# Patient Record
Sex: Male | Born: 1995 | Race: Black or African American | Hispanic: No | Marital: Single | Smoking: Current some day smoker
Health system: Southern US, Community
[De-identification: ages and names within clinical notes are randomized; demographics above are authoritative.]

## PROBLEM LIST (undated history)

## (undated) DIAGNOSIS — T7840XA Allergy, unspecified, initial encounter: Secondary | ICD-10-CM

## (undated) HISTORY — DX: Allergy, unspecified, initial encounter: T78.40XA

---
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
## 2014-12-04 ENCOUNTER — Encounter: Payer: Self-pay | Admitting: Internal Medicine

## 2015-02-16 ENCOUNTER — Ambulatory Visit (INDEPENDENT_AMBULATORY_CARE_PROVIDER_SITE_OTHER): Payer: Managed Care, Other (non HMO)

## 2015-02-16 ENCOUNTER — Ambulatory Visit (INDEPENDENT_AMBULATORY_CARE_PROVIDER_SITE_OTHER): Payer: Managed Care, Other (non HMO) | Admitting: Family Medicine

## 2015-02-16 VITALS — BP 122/70 | HR 66 | Temp 98.4°F | Resp 17 | Ht 67.0 in | Wt 159.0 lb

## 2015-02-16 DIAGNOSIS — M25531 Pain in right wrist: Secondary | ICD-10-CM

## 2015-02-16 DIAGNOSIS — M25511 Pain in right shoulder: Secondary | ICD-10-CM | POA: Diagnosis not present

## 2015-02-16 DIAGNOSIS — S46911A Strain of unspecified muscle, fascia and tendon at shoulder and upper arm level, right arm, initial encounter: Secondary | ICD-10-CM

## 2015-02-16 DIAGNOSIS — S66911A Strain of unspecified muscle, fascia and tendon at wrist and hand level, right hand, initial encounter: Secondary | ICD-10-CM | POA: Diagnosis not present

## 2015-02-16 MED ORDER — MELOXICAM 15 MG PO TABS: 15.0000 mg | ORAL_TABLET | Freq: Every day | ORAL | Status: AC

## 2015-02-16 MED ORDER — METHOCARBAMOL 500 MG PO TABS: 500.0000 mg | ORAL_TABLET | Freq: Every evening | ORAL | Status: AC | PRN

## 2015-02-16 NOTE — Patient Instructions (Addendum)
Because you received an x-ray today, you will receive an invoice from [facility]. Please contact [facility] at [phone] with questions or concerns regarding your invoice. Our billing staff will not be able to assist you with those questions. Impingement Syndrome, Rotator Cuff, Bursitis With Rehab Impingement syndrome is a condition that involves inflammation of the tendons of the rotator cuff and the subacromial bursa, that causes pain in the shoulder. The rotator cuff consists of four tendons and muscles that control much of the shoulder and upper arm function. The subacromial bursa is a fluid filled sac that helps reduce friction between the rotator cuff and one of the bones of the shoulder (acromion). Impingement syndrome is usually an overuse injury that causes swelling of the bursa (bursitis), swelling of the tendon (tendonitis), and/or a tear of the tendon (strain). Strains are classified into three categories. Grade 1 strains cause pain, but the tendon is not lengthened. Grade 2 strains include a lengthened ligament, due to the ligament being stretched or partially ruptured. With grade 2 strains there is still function, although the function may be decreased. Grade 3 strains include a complete tear of the tendon or muscle, and function is usually impaired. SYMPTOMS   Pain around the shoulder, often at the outer portion of the upper arm.  Pain that gets worse with shoulder function, especially when reaching overhead or lifting.  Sometimes, aching when not using the arm.  Pain that wakes you up at night.  Sometimes, tenderness, swelling, warmth, or redness over the affected area.  Loss of strength.  Limited motion of the shoulder, especially reaching behind the back (to the back pocket or to unhook bra) or across your body.  Crackling sound (crepitation) when moving the arm.  Biceps tendon pain and inflammation (in the front of the shoulder). Worse when bending the  elbow or lifting. CAUSES  Impingement syndrome is often an overuse injury, in which chronic (repetitive) motions cause the tendons or bursa to become inflamed. A strain occurs when a force is paced on the tendon or muscle that is greater than it can withstand. Common mechanisms of injury include: Stress from sudden increase in duration, frequency, or intensity of training.  Direct hit (trauma) to the shoulder.  Aging, erosion of the tendon with normal use.  Bony bump on shoulder (acromial spur). RISK INCREASES WITH:  Contact sports (football, wrestling, boxing).  Throwing sports (baseball, tennis, volleyball).  Weightlifting and bodybuilding.  Heavy labor.  Previous injury to the rotator cuff, including impingement.  Poor shoulder strength and flexibility.  Failure to warm up properly before activity.  Inadequate protective equipment.  Old age.  Bony bump on shoulder (acromial spur). PREVENTION   Warm up and stretch properly before activity.  Allow for adequate recovery between workouts.  Maintain physical fitness:  Strength, flexibility, and endurance.  Cardiovascular fitness.  Learn and use proper exercise technique. PROGNOSIS  If treated properly, impingement syndrome usually goes away within 6 weeks. Sometimes surgery is required.  RELATED COMPLICATIONS   Longer healing time if not properly treated, or if not given enough time to heal.  Recurring symptoms, that result in a chronic condition.  Shoulder stiffness, frozen shoulder, or loss of motion.  Rotator cuff tendon tear.  Recurring symptoms, especially if activity is resumed too soon, with overuse, with a direct blow, or when using poor technique. TREATMENT  Treatment first involves the use of ice and medicine, to reduce pain and inflammation. The use of strengthening and stretching exercises may help   reduce pain with activity. These exercises may be performed at home or with a therapist. If  non-surgical treatment is unsuccessful after more than 6 months, surgery may be advised. After surgery and rehabilitation, activity is usually possible in 3 months.  MEDICATION  If pain medicine is needed, nonsteroidal anti-inflammatory medicines (aspirin and ibuprofen), or other minor pain relievers (acetaminophen), are often advised.  Do not take pain medicine for 7 days before surgery.  Prescription pain relievers may be given, if your caregiver thinks they are needed. Use only as directed and only as much as you need.  Corticosteroid injections may be given by your caregiver. These injections should be reserved for the most serious cases, because they may only be given a certain number of times. HEAT AND COLD  Cold treatment (icing) should be applied for 10 to 15 minutes every 2 to 3 hours for inflammation and pain, and immediately after activity that aggravates your symptoms. Use ice packs or an ice massage.  Heat treatment may be used before performing stretching and strengthening activities prescribed by your caregiver, physical therapist, or athletic trainer. Use a heat pack or a warm water soak. SEEK MEDICAL CARE IF:   Symptoms get worse or do not improve in 4 to 6 weeks, despite treatment.  New, unexplained symptoms develop. (Drugs used in treatment may produce side effects.) EXERCISES  RANGE OF MOTION (ROM) AND STRETCHING EXERCISES - Impingement Syndrome (Rotator Cuff  Tendinitis, Bursitis) These exercises may help you when beginning to rehabilitate your injury. Your symptoms may go away with or without further involvement from your physician, physical therapist or athletic trainer. While completing these exercises, remember:   Restoring tissue flexibility helps normal motion to return to the joints. This allows healthier, less painful movement and activity.  An effective stretch should be held for at least 30 seconds.  A stretch should never be painful. You should only feel a  gentle lengthening or release in the stretched tissue. STRETCH - Flexion, Standing  Stand with good posture. With an underhand grip on your right / left hand, and an overhand grip on the opposite hand, grasp a broomstick or cane so that your hands are a little more than shoulder width apart.  Keeping your right / left elbow straight and shoulder muscles relaxed, push the stick with your opposite hand, to raise your right / left arm in front of your body and then overhead. Raise your arm until you feel a stretch in your right / left shoulder, but before you have increased shoulder pain.  Try to avoid shrugging your right / left shoulder as your arm rises, by keeping your shoulder blade tucked down and toward your mid-back spine. Hold for __________ seconds.  Slowly return to the starting position. Repeat __________ times. Complete this exercise __________ times per day. STRETCH - Abduction, Supine  Lie on your back. With an underhand grip on your right / left hand and an overhand grip on the opposite hand, grasp a broomstick or cane so that your hands are a little more than shoulder width apart.  Keeping your right / left elbow straight and your shoulder muscles relaxed, push the stick with your opposite hand, to raise your right / left arm out to the side of your body and then overhead. Raise your arm until you feel a stretch in your right / left shoulder, but before you have increased shoulder pain.  Try to avoid shrugging your right / left shoulder as your arm rises, by   keeping your shoulder blade tucked down and toward your mid-back spine. Hold for __________ seconds.  Slowly return to the starting position. Repeat __________ times. Complete this exercise __________ times per day. ROM - Flexion, Active-Assisted  Lie on your back. You may bend your knees for comfort.  Grasp a broomstick or cane so your hands are about shoulder width apart. Your right / left hand should grip the end of the  stick, so that your hand is positioned "thumbs-up," as if you were about to shake hands.  Using your healthy arm to lead, raise your right / left arm overhead, until you feel a gentle stretch in your shoulder. Hold for __________ seconds.  Use the stick to assist in returning your right / left arm to its starting position. Repeat __________ times. Complete this exercise __________ times per day.  ROM - Internal Rotation, Supine   Lie on your back on a firm surface. Place your right / left elbow about 60 degrees away from your side. Elevate your elbow with a folded towel, so that the elbow and shoulder are the same height.  Using a broomstick or cane and your strong arm, pull your right / left hand toward your body until you feel a gentle stretch, but no increase in your shoulder pain. Keep your shoulder and elbow in place throughout the exercise.  Hold for __________ seconds. Slowly return to the starting position. Repeat __________ times. Complete this exercise __________ times per day. STRETCH - Internal Rotation  Place your right / left hand behind your back, palm up.  Throw a towel or belt over your opposite shoulder. Grasp the towel with your right / left hand.  While keeping an upright posture, gently pull up on the towel, until you feel a stretch in the front of your right / left shoulder.  Avoid shrugging your right / left shoulder as your arm rises, by keeping your shoulder blade tucked down and toward your mid-back spine.  Hold for __________ seconds. Release the stretch, by lowering your healthy hand. Repeat __________ times. Complete this exercise __________ times per day. ROM - Internal Rotation   Using an underhand grip, grasp a stick behind your back with both hands.  While standing upright with good posture, slide the stick up your back until you feel a mild stretch in the front of your shoulder.  Hold for __________ seconds. Slowly return to your starting  position. Repeat __________ times. Complete this exercise __________ times per day.  STRETCH - Posterior Shoulder Capsule   Stand or sit with good posture. Grasp your right / left elbow and draw it across your chest, keeping it at the same height as your shoulder.  Pull your elbow, so your upper arm comes in closer to your chest. Pull until you feel a gentle stretch in the back of your shoulder.  Hold for __________ seconds. Repeat __________ times. Complete this exercise __________ times per day. STRENGTHENING EXERCISES - Impingement Syndrome (Rotator Cuff Tendinitis, Bursitis) These exercises may help you when beginning to rehabilitate your injury. They may resolve your symptoms with or without further involvement from your physician, physical therapist or athletic trainer. While completing these exercises, remember:  Muscles can gain both the endurance and the strength needed for everyday activities through controlled exercises.  Complete these exercises as instructed by your physician, physical therapist or athletic trainer. Increase the resistance and repetitions only as guided.  You may experience muscle soreness or fatigue, but the pain or discomfort you   are trying to eliminate should never worsen during these exercises. If this pain does get worse, stop and make sure you are following the directions exactly. If the pain is still present after adjustments, discontinue the exercise until you can discuss the trouble with your clinician.  During your recovery, avoid activity or exercises which involve actions that place your injured hand or elbow above your head or behind your back or head. These positions stress the tissues which you are trying to heal. STRENGTH - Scapular Depression and Adduction   With good posture, sit on a firm chair. Support your arms in front of you, with pillows, arm rests, or on a table top. Have your elbows in line with the sides of your body.  Gently draw your  shoulder blades down and toward your mid-back spine. Gradually increase the tension, without tensing the muscles along the top of your shoulders and the back of your neck.  Hold for __________ seconds. Slowly release the tension and relax your muscles completely before starting the next repetition.  After you have practiced this exercise, remove the arm support and complete the exercise in standing as well as sitting position. Repeat __________ times. Complete this exercise __________ times per day.  STRENGTH - Shoulder Abductors, Isometric  With good posture, stand or sit about 4-6 inches from a wall, with your right / left side facing the wall.  Bend your right / left elbow. Gently press your right / left elbow into the wall. Increase the pressure gradually, until you are pressing as hard as you can, without shrugging your shoulder or increasing any shoulder discomfort.  Hold for __________ seconds.  Release the tension slowly. Relax your shoulder muscles completely before you begin the next repetition. Repeat __________ times. Complete this exercise __________ times per day.  STRENGTH - External Rotators, Isometric  Keep your right / left elbow at your side and bend it 90 degrees.  Step into a door frame so that the outside of your right / left wrist can press against the door frame without your upper arm leaving your side.  Gently press your right / left wrist into the door frame, as if you were trying to swing the back of your hand away from your stomach. Gradually increase the tension, until you are pressing as hard as you can, without shrugging your shoulder or increasing any shoulder discomfort.  Hold for __________ seconds.  Release the tension slowly. Relax your shoulder muscles completely before you begin the next repetition. Repeat __________ times. Complete this exercise __________ times per day.  STRENGTH - Supraspinatus   Stand or sit with good posture. Grasp a __________  weight, or an exercise band or tubing, so that your hand is "thumbs-up," like you are shaking hands.  Slowly lift your right / left arm in a "V" away from your thigh, diagonally into the space between your side and straight ahead. Lift your hand to shoulder height or as far as you can, without increasing any shoulder pain. At first, many people do not lift their hands above shoulder height.  Avoid shrugging your right / left shoulder as your arm rises, by keeping your shoulder blade tucked down and toward your mid-back spine.  Hold for __________ seconds. Control the descent of your hand, as you slowly return to your starting position. Repeat __________ times. Complete this exercise __________ times per day.  STRENGTH - External Rotators  Secure a rubber exercise band or tubing to a fixed object (table, pole)   so that it is at the same height as your right / left elbow when you are standing or sitting on a firm surface.  Stand or sit so that the secured exercise band is at your uninjured side.  Bend your right / left elbow 90 degrees. Place a folded towel or small pillow under your right / left arm, so that your elbow is a few inches away from your side.  Keeping the tension on the exercise band, pull it away from your body, as if pivoting on your elbow. Be sure to keep your body steady, so that the movement is coming only from your rotating shoulder.  Hold for __________ seconds. Release the tension in a controlled manner, as you return to the starting position. Repeat __________ times. Complete this exercise __________ times per day.  STRENGTH - Internal Rotators   Secure a rubber exercise band or tubing to a fixed object (table, pole) so that it is at the same height as your right / left elbow when you are standing or sitting on a firm surface.  Stand or sit so that the secured exercise band is at your right / left side.  Bend your elbow 90 degrees. Place a folded towel or small pillow  under your right / left arm so that your elbow is a few inches away from your side.  Keeping the tension on the exercise band, pull it across your body, toward your stomach. Be sure to keep your body steady, so that the movement is coming only from your rotating shoulder.  Hold for __________ seconds. Release the tension in a controlled manner, as you return to the starting position. Repeat __________ times. Complete this exercise __________ times per day.  STRENGTH - Scapular Protractors, Standing   Stand arms length away from a wall. Place your hands on the wall, keeping your elbows straight.  Begin by dropping your shoulder blades down and toward your mid-back spine.  To strengthen your protractors, keep your shoulder blades down, but slide them forward on your rib cage. It will feel as if you are lifting the back of your rib cage away from the wall. This is a subtle motion and can be challenging to complete. Ask your caregiver for further instruction, if you are not sure you are doing the exercise correctly.  Hold for __________ seconds. Slowly return to the starting position, resting the muscles completely before starting the next repetition. Repeat __________ times. Complete this exercise __________ times per day. STRENGTH - Scapular Protractors, Supine  Lie on your back on a firm surface. Extend your right / left arm straight into the air while holding a __________ weight in your hand.  Keeping your head and back in place, lift your shoulder off the floor.  Hold for __________ seconds. Slowly return to the starting position, and allow your muscles to relax completely before starting the next repetition. Repeat __________ times. Complete this exercise __________ times per day. STRENGTH - Scapular Protractors, Quadruped  Get onto your hands and knees, with your shoulders directly over your hands (or as close as you can be, comfortably).  Keeping your elbows locked, lift the back of  your rib cage up into your shoulder blades, so your mid-back rounds out. Keep your neck muscles relaxed.  Hold this position for __________ seconds. Slowly return to the starting position and allow your muscles to relax completely before starting the next repetition. Repeat __________ times. Complete this exercise __________ times per day.  STRENGTH -   Scapular Retractors  Secure a rubber exercise band or tubing to a fixed object (table, pole), so that it is at the height of your shoulders when you are either standing, or sitting on a firm armless chair.  With a palm down grip, grasp an end of the band in each hand. Straighten your elbows and lift your hands straight in front of you, at shoulder height. Step back, away from the secured end of the band, until it becomes tense.  Squeezing your shoulder blades together, draw your elbows back toward your sides, as you bend them. Keep your upper arms lifted away from your body throughout the exercise.  Hold for __________ seconds. Slowly ease the tension on the band, as you reverse the directions and return to the starting position. Repeat __________ times. Complete this exercise __________ times per day. STRENGTH - Shoulder Extensors   Secure a rubber exercise band or tubing to a fixed object (table, pole) so that it is at the height of your shoulders when you are either standing, or sitting on a firm armless chair.  With a thumbs-up grip, grasp an end of the band in each hand. Straighten your elbows and lift your hands straight in front of you, at shoulder height. Step back, away from the secured end of the band, until it becomes tense.  Squeezing your shoulder blades together, pull your hands down to the sides of your thighs. Do not allow your hands to go behind you.  Hold for __________ seconds. Slowly ease the tension on the band, as you reverse the directions and return to the starting position. Repeat __________ times. Complete this exercise  __________ times per day.  STRENGTH - Scapular Retractors and External Rotators   Secure a rubber exercise band or tubing to a fixed object (table, pole) so that it is at the height as your shoulders, when you are either standing, or sitting on a firm armless chair.  With a palm down grip, grasp an end of the band in each hand. Bend your elbows 90 degrees and lift your elbows to shoulder height, at your sides. Step back, away from the secured end of the band, until it becomes tense.  Squeezing your shoulder blades together, rotate your shoulders so that your upper arms and elbows remain stationary, but your fists travel upward to head height.  Hold for __________ seconds. Slowly ease the tension on the band, as you reverse the directions and return to the starting position. Repeat __________ times. Complete this exercise __________ times per day.  STRENGTH - Scapular Retractors and External Rotators, Rowing   Secure a rubber exercise band or tubing to a fixed object (table, pole) so that it is at the height of your shoulders, when you are either standing, or sitting on a firm armless chair.  With a palm down grip, grasp an end of the band in each hand. Straighten your elbows and lift your hands straight in front of you, at shoulder height. Step back, away from the secured end of the band, until it becomes tense.  Step 1: Squeeze your shoulder blades together. Bending your elbows, draw your hands to your chest, as if you are rowing a boat. At the end of this motion, your hands and elbow should be at shoulder height and your elbows should be out to your sides.  Step 2: Rotate your shoulders, to raise your hands above your head. Your forearms should be vertical and your upper arms should be horizontal.  Hold   for __________ seconds. Slowly ease the tension on the band, as you reverse the directions and return to the starting position. Repeat __________ times. Complete this exercise __________ times  per day.  STRENGTH - Scapular Depressors  Find a sturdy chair without wheels, such as a dining room chair.  Keeping your feet on the floor, and your hands on the chair arms, lift your bottom up from the seat, and lock your elbows.  Keeping your elbows straight, allow gravity to pull your body weight down. Your shoulders will rise toward your ears.  Raise your body against gravity by drawing your shoulder blades down your back, shortening the distance between your shoulders and ears. Although your feet should always maintain contact with the floor, your feet should progressively support less body weight, as you get stronger.  Hold for __________ seconds. In a controlled and slow manner, lower your body weight to begin the next repetition. Repeat __________ times. Complete this exercise __________ times per day.    This information is not intended to replace advice given to you by your health care provider. Make sure you discuss any questions you have with your health care provider.   Document Released: 12/23/2004 Document Revised: 01/13/2014 Document Reviewed: 04/06/2008 [employer] Patient Education 2016 [employer]  

## 2015-02-16 NOTE — Progress Notes (Signed)
Subjective:    Patient ID: Vincent Ryan, male    DOB: Feb 03, 1995, 20 y.o.   MRN: [id number]  02/16/2015  Wrist Pain and Shoulder Pain   HPI This 20 y.o. male presents for evaluation of the following:  Had fall five feet snowboarding one week ago.  Came down on R wrist on outstretched hand.  No swelling.  Forklift driver.  R handed.  No n/t in hand; initially but no longer.  No ice or heat.  Ibuprofen initially.  R shoulder pain: started before the fall one week ago; onset one month ago. Neck pain and shoulder pain; looking to the right causes pain. No radiation into arm.  No n/t.  No heat or ice.  Ibuprofen none.     Review of Systems  Constitutional: Negative for fever, chills, diaphoresis and fatigue.  Respiratory: Negative for cough, shortness of breath and stridor.   Cardiovascular: Negative for palpitations and leg swelling.  Musculoskeletal: Positive for myalgias, back pain, joint swelling, arthralgias, neck pain and neck stiffness.  Neurological: Negative for dizziness, tremors, seizures, syncope, facial asymmetry, speech difficulty, weakness, light-headedness, numbness and headaches.    Past Medical History  Diagnosis Date  . Allergy    History reviewed. No pertinent past surgical history. No Known Allergies  Social History   Social History  . Marital Status: Single    Spouse Name: N/A  . Number of Children: N/A  . Years of Education: N/A   Occupational History  . Not on file.   Social History Main Topics  . Smoking status: Current Some Day Smoker -- 0.20 packs/day for 3 years    Types: Cigarettes  . Smokeless tobacco: Not on file     Comment: smokes occasionally, not everyday  . Alcohol Use: No  . Drug Use: No  . Sexual Activity: Not on file   Other Topics Concern  . Not on file   Social History Narrative   History reviewed. No pertinent family history.     Objective:    BP 122/70 mmHg  Pulse 66  Temp(Src) 98.4 F (36.9 C) (Oral)  Resp 17   Ht  (1.702 m)  Wt 159 lb (72.122 kg)  BMI 24.90 kg/m2  SpO2 98% Physical Exam  Constitutional: He is oriented to person, place, and time. He appears well-developed and well-nourished. No distress.  HENT:  Head: Normocephalic and atraumatic.  Eyes: Conjunctivae and EOM are normal. Pupils are equal, round, and reactive to light.  Neck: Normal range of motion. Neck supple. Carotid bruit is not present. No thyromegaly present.  Cardiovascular: Normal rate, regular rhythm, normal heart sounds and intact distal pulses.  Exam reveals no gallop and no friction rub.   No murmur heard. Pulmonary/Chest: Effort normal and breath sounds normal. He has no wheezes. He has no rales.  Musculoskeletal:       Right shoulder: He exhibits pain. He exhibits normal range of motion, no tenderness, no bony tenderness, no spasm, normal pulse and normal strength.       Right elbow: Normal.He exhibits normal range of motion, no swelling, no effusion, no deformity and no laceration. No tenderness found. No radial head, no medial epicondyle, no lateral epicondyle and no olecranon process tenderness noted.       Right wrist: He exhibits tenderness, bony tenderness and swelling. He exhibits normal range of motion, no effusion, no crepitus, no deformity and no laceration.       Cervical back: He exhibits tenderness, pain and spasm. He  exhibits normal range of motion, no bony tenderness, no swelling and normal pulse.       Thoracic back: Normal. He exhibits normal range of motion, no tenderness and no bony tenderness.       Lumbar back: Normal. He exhibits normal range of motion, no tenderness and no bony tenderness.       Right forearm: Normal. He exhibits no tenderness, no bony tenderness, no swelling, no edema, no deformity and no laceration.       Right hand: He exhibits normal range of motion, no tenderness, normal two-point discrimination, normal capillary refill, no deformity and no swelling. Normal sensation noted.  Normal strength noted.  No R snuffbox tenderness.  Lymphadenopathy:    He has no cervical adenopathy.  Neurological: He is alert and oriented to person, place, and time. No cranial nerve deficit.  Skin: Skin is warm and dry. No rash noted. He is not diaphoretic.  Psychiatric: He has a normal mood and affect. His behavior is normal.  Nursing note and vitals reviewed.  No results found for this or any previous visit.     Assessment & Plan:   1. Right wrist pain   2. Pain in joint of right shoulder   3. Wrist strain, right, initial encounter   4. Right shoulder strain, initial encounter     Orders Placed This Encounter  Procedures  . DG Wrist Complete Right    Standing Status: Future     Number of Occurrences: 1     Standing Expiration Date: 02/16/2016    Order Specific Question:  Reason for Exam (SYMPTOM  OR DIAGNOSIS REQUIRED)    Answer:  R shoulder and neck pain    Order Specific Question:  Preferred imaging location?    Answer:  External  . DG Shoulder Right    Standing Status: Future     Number of Occurrences: 1     Standing Expiration Date: 02/16/2016    Order Specific Question:  Reason for Exam (SYMPTOM  OR DIAGNOSIS REQUIRED)    Answer:  R shoulder and neck pain    Order Specific Question:  Preferred imaging location?    Answer:  External  . Ambulatory referral to Orthopedic Surgery    Referral Priority:  Routine    Referral Type:  Surgical    Referral Reason:  Specialty Services Required    Requested Specialty:  Orthopedic Surgery    Number of Visits Requested:  1   Meds ordered this encounter  Medications  . meloxicam (MOBIC) 15 MG tablet    Sig: Take 1 tablet (15 mg total) by mouth daily.    Dispense:  30 tablet    Refill:  0  . methocarbamol (ROBAXIN) 500 MG tablet    Sig: Take 1-2 tablets (500-1,000 mg total) by mouth at bedtime as needed for muscle spasms.    Dispense:  40 tablet    Refill:  0    No Follow-up on file.    Kristi Paulita Fujita,  M.D. [facility] [address] [place], [place]  [address] [phone] phone [phone] fax

## 2015-03-30 ENCOUNTER — Telehealth: Payer: Self-pay | Admitting: *Deleted

## 2015-03-30 NOTE — Telephone Encounter (Signed)
Pt came in for note to be out of from from 03/28/2015 til today.  Advised pt that he should have gotten note from orthopedic office.  He stated that do not write [facility] letters for the first time being seen there. I tried calling office to see what was going on and they were close.  Advised pt to call there office on Monday.

## 2015-12-17 ENCOUNTER — Ambulatory Visit (INDEPENDENT_AMBULATORY_CARE_PROVIDER_SITE_OTHER): Payer: Managed Care, Other (non HMO) | Admitting: Physician Assistant

## 2015-12-17 VITALS — BP 110/64 | HR 80 | Temp 98.9°F | Ht 67.5 in | Wt 148.0 lb

## 2015-12-17 DIAGNOSIS — J069 Acute upper respiratory infection, unspecified: Secondary | ICD-10-CM | POA: Diagnosis not present

## 2015-12-17 NOTE — Patient Instructions (Addendum)
You have a cold.  Consider taking Claritin-D once daily in the morning along with Aleve in the morning and night. Consider Afrin nasal at night to help you breath through your nose while sleeping, but do not do this for more than three days total.     IF you received an x-ray today, you will receive an invoice from [facility]. Please contact [facility] at [phone] with questions or concerns regarding your invoice.   IF you received labwork today, you will receive an invoice from [facility]. Please contact [facility] at [phone] with questions or concerns regarding your invoice.   Our billing staff will not be able to assist you with questions regarding bills from these companies.  You will be contacted with the lab results as soon as they are available. The fastest way to get your results is to activate your My Chart account. Instructions are located on the last page of this paperwork. If you have not heard from us regarding the results in 2 weeks, please contact this office.

## 2015-12-17 NOTE — Progress Notes (Signed)
   12/17/2015 7:03 PM   DOB: [birth year] / MRN: [id number]  SUBJECTIVE:  Vincent Ryan is a 20 y.o. male presenting for nasal congestion that started today.  Complains of a mild fever, HA, and sneezing. 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 Denies cough. Has myalgia but thinks this is due to snow boarding. He has not had the seasonal flu shot.   He has No Known Allergies.   He  has a past medical history of Allergy.    He  reports that he has been smoking Cigarettes.  He has a 0.60 pack-year smoking history. He has never used smokeless tobacco. He reports that he does not drink alcohol or use drugs. He  has no sexual activity history on file. The patient  has no past surgical history on file.  His family history is not on file.  Review of Systems  Musculoskeletal: Negative for myalgias.  Skin: Negative for rash.  Neurological: Negative for dizziness.    The problem list and medications were reviewed and updated by myself where necessary and exist elsewhere in the encounter.   OBJECTIVE:  BP 110/64 (BP Location: Right Arm, Patient Position: Sitting, Cuff Size: Small)   Pulse 80   Temp 98.9 F (37.2 C) (Oral)   Ht 5' 7.5" (1.715 m)   Wt 148 lb (67.1 kg)   SpO2 97%   BMI 22.84 kg/m   Physical Exam  HENT:  Right Ear: Tympanic membrane normal.  Left Ear: Tympanic membrane normal.  Nose: Nose normal.  Mouth/Throat: Uvula is midline, oropharynx is clear and moist and mucous membranes are normal.  Cardiovascular: Normal rate and regular rhythm.   Pulmonary/Chest: Effort normal and breath sounds normal. No respiratory distress. He has no wheezes. He has no rales. He exhibits no tenderness.    No results found for this or any previous visit (from the past 72 hour(s)).  No results found.  ASSESSMENT AND PLAN  Alinda Moneyony was seen today for sore throat.  Diagnoses and all orders for this visit:  Acute URI:  Likely viral. Note for work  provided.  Advised OTC prep of choice.  Will provide abx if symptoms are greater than 10 days of duration.     The patient is advised to call or return to clinic if he does not see an improvement in symptoms, or to seek the care of the closest emergency department if he worsens with the above plan.   Deliah BostonMichael Maryrose Colvin, MHS, PA-C [facility] 12/17/2015 7:03 PM

## 2017-02-27 IMAGING — CR DG WRIST COMPLETE 3+V*R*
4 series · 4 of 4 positions shown · non-contrast
Comparison: None.

CLINICAL DATA: 19-year-old male with fall and right wrist pain.

EXAM:
RIGHT WRIST - COMPLETE 3+ VIEW

[PA]
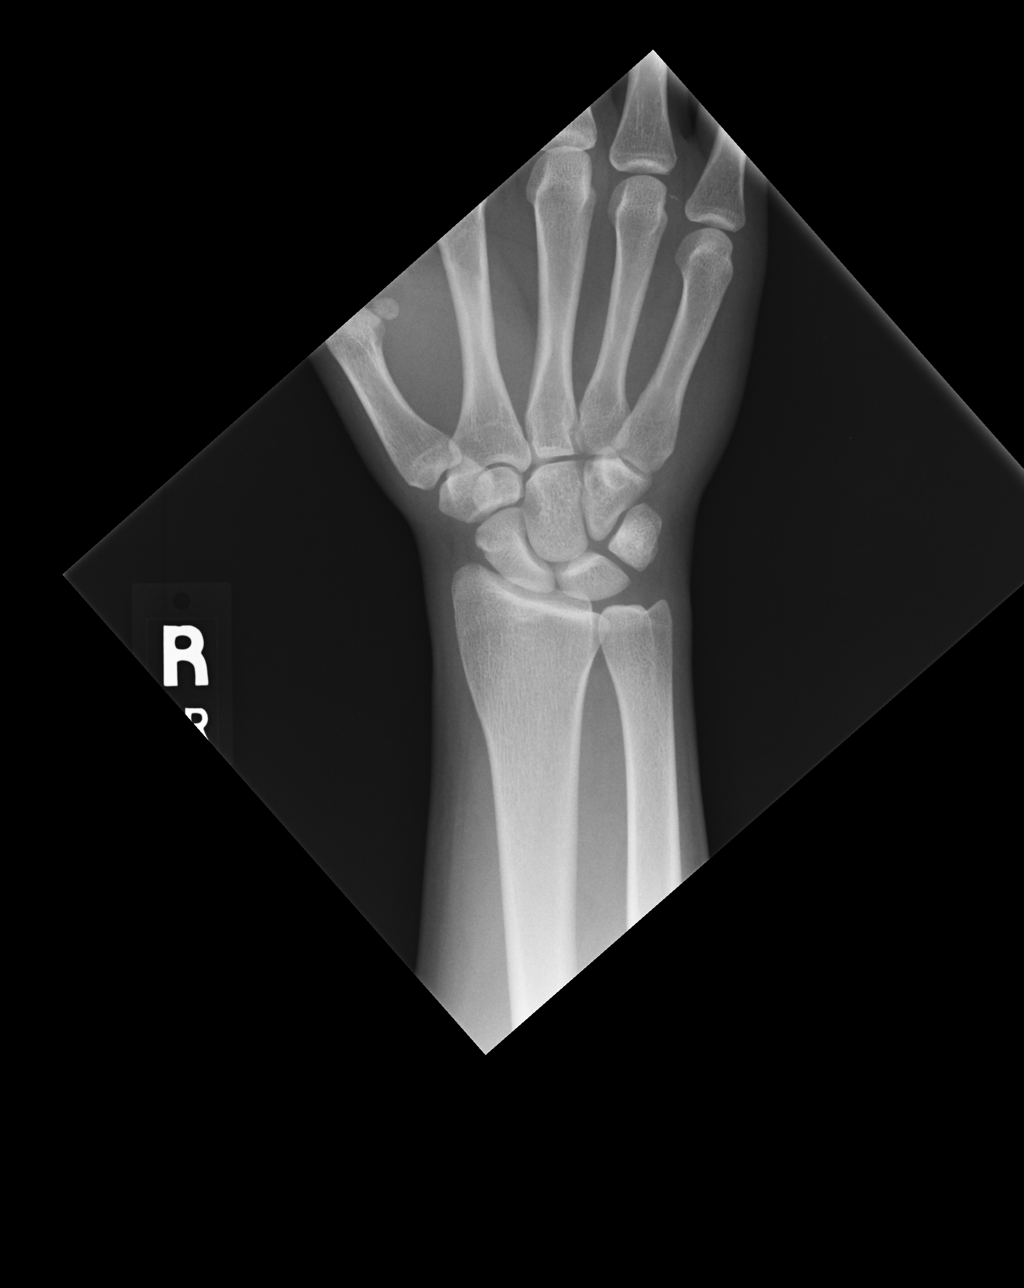

[pa int rot]
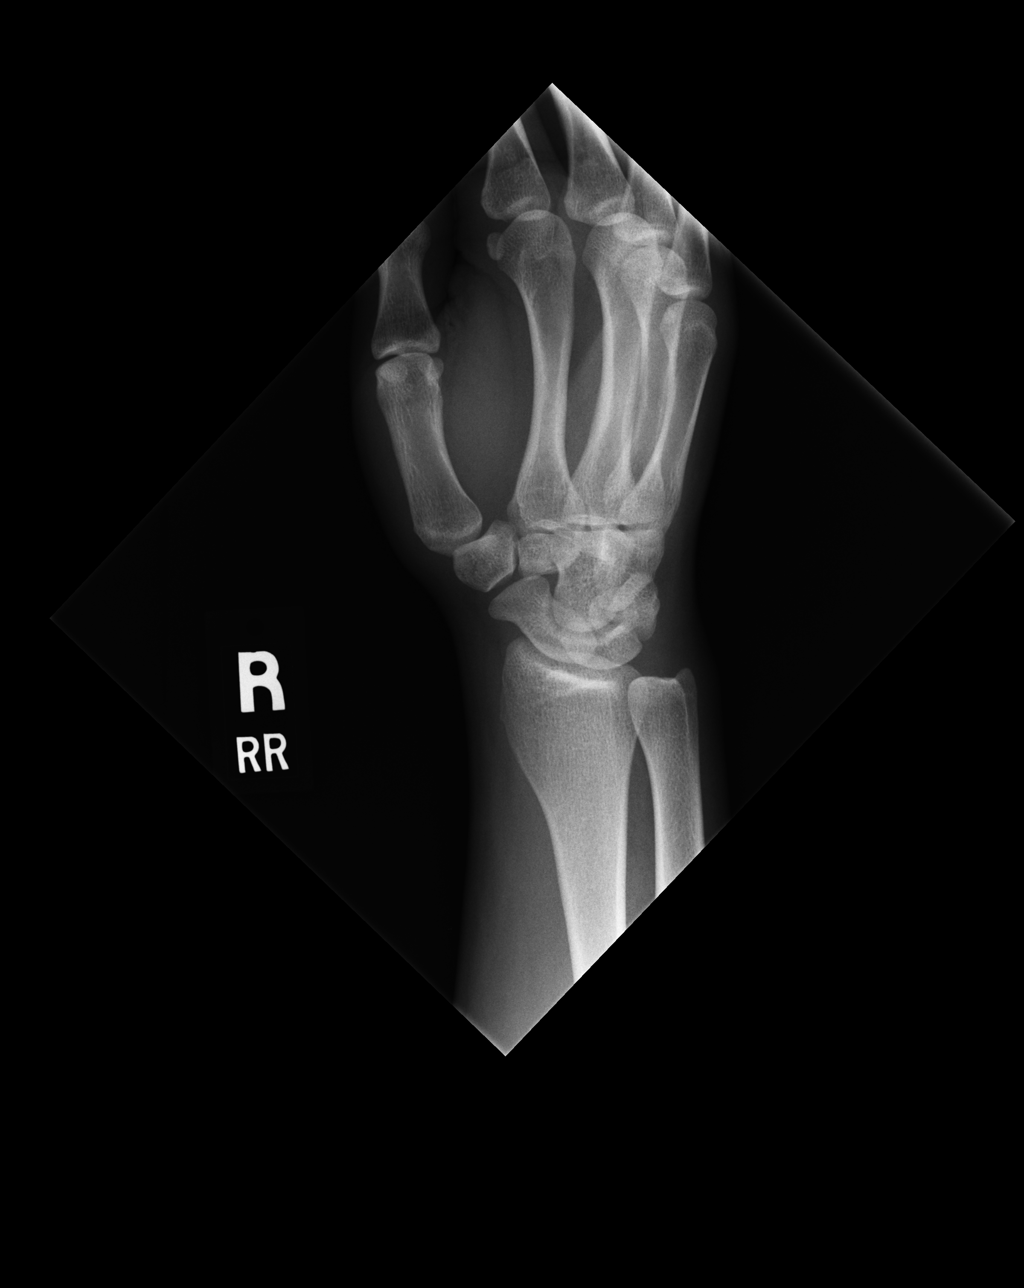

[lateral]
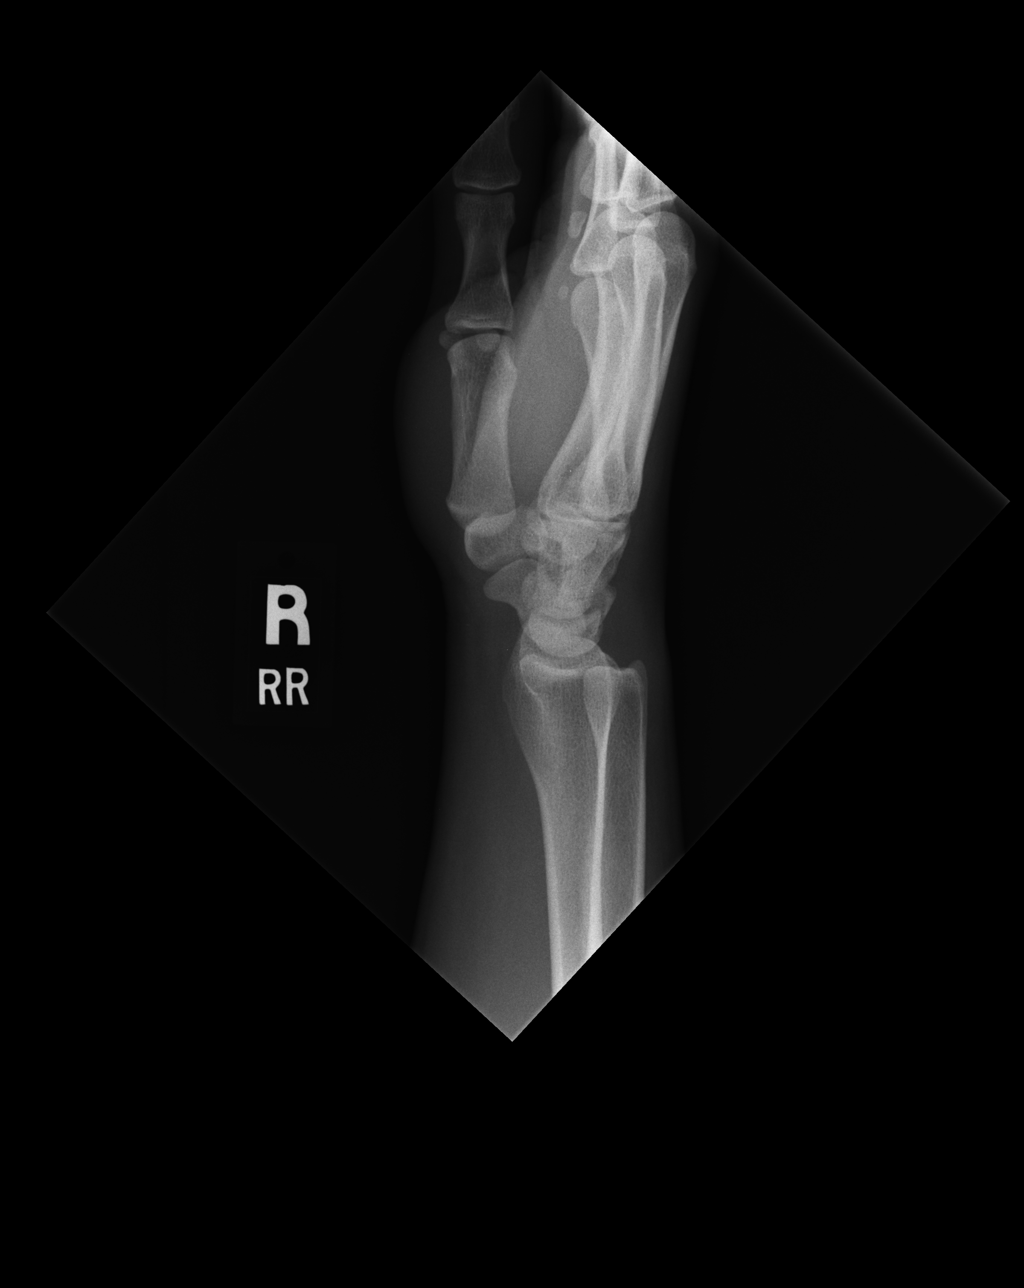

[ap ext rot]
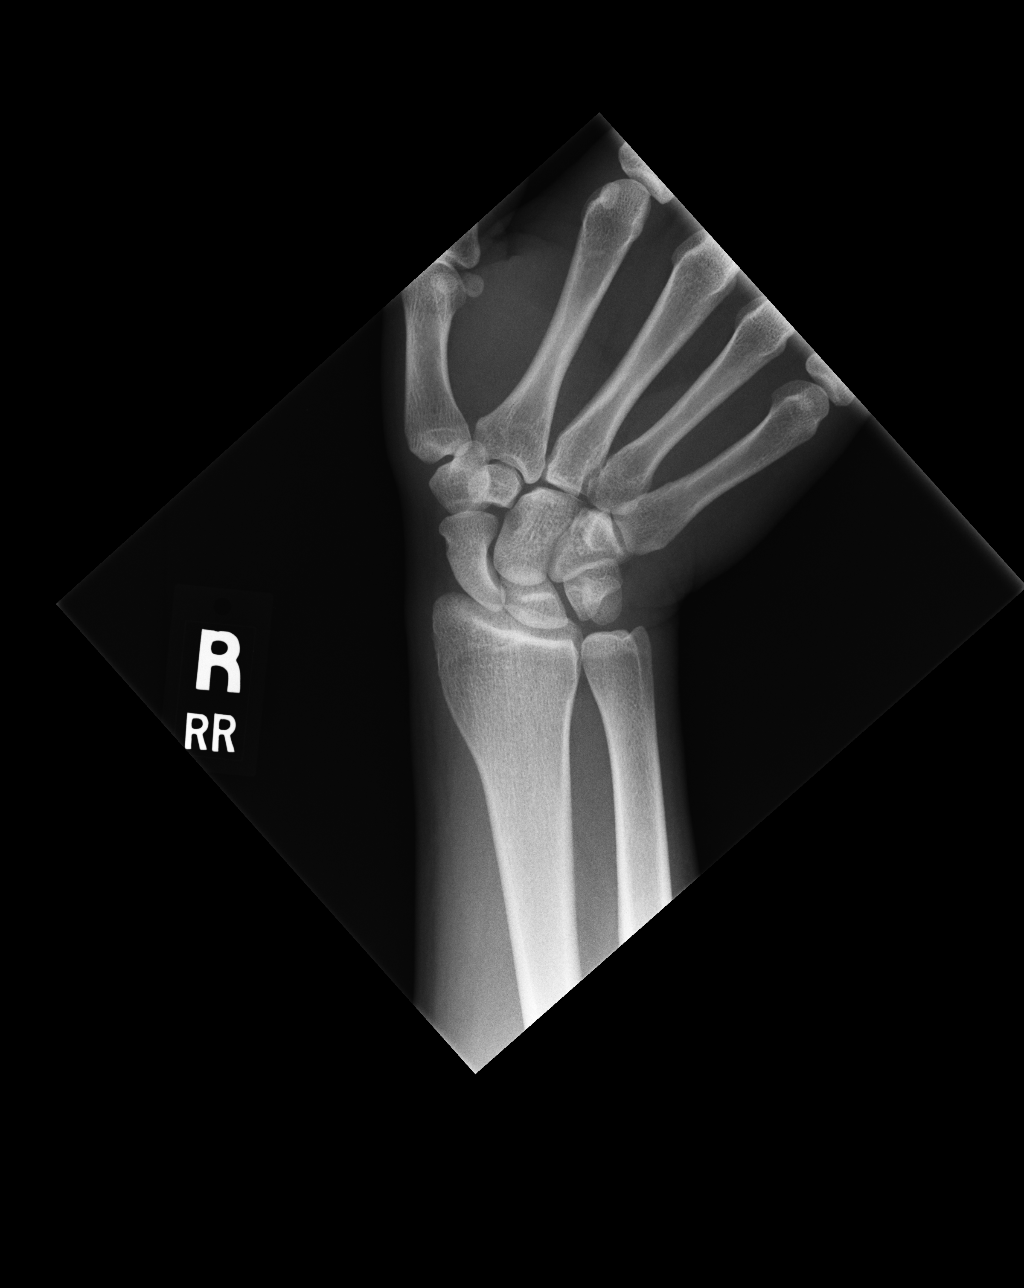

[4 of 4 positions shown; findings below may reference images not displayed]

FINDINGS: There is no evidence of fracture or dislocation. There is no
evidence of arthropathy or other focal bone abnormality. Soft
tissues are unremarkable.
IMPRESSION: Negative.

## 2019-04-15 ENCOUNTER — Ambulatory Visit: Payer: Self-pay | Attending: Internal Medicine

## 2019-04-15 DIAGNOSIS — Z23 Encounter for immunization: Secondary | ICD-10-CM

## 2019-04-15 NOTE — Progress Notes (Signed)
   Covid-19 Vaccination Clinic  Name:  Vincent Ryan    MRN: [id number] DOB: June 13, 1995  04/15/2019  [name] was observed post Covid-19 immunization for 15 minutes without incident. He was provided with Vaccine Information Sheet and instruction to access the V-Safe system.   [name] was instructed to call 911 with any severe reactions post vaccine: Marland Kitchen Difficulty breathing  . Swelling of face and throat  . A fast heartbeat  . A bad rash all over body  . Dizziness and weakness   Immunizations Administered    Name Date Dose VIS Date Route   Pfizer COVID-19 Vaccine 04/15/2019  2:36 PM 0.3 mL 12/17/2018 Intramuscular   Manufacturer: [employer], [employer]   Lot: [id number]   NDC: [address]-1

## 2019-05-11 ENCOUNTER — Ambulatory Visit: Payer: Self-pay | Attending: Internal Medicine

## 2019-05-11 DIAGNOSIS — Z23 Encounter for immunization: Secondary | ICD-10-CM

## 2019-05-11 NOTE — Progress Notes (Signed)
   Covid-19 Vaccination Clinic  Name:  Vincent Ryan    MRN: [id number] DOB: 01-20-1995  05/11/2019  Mr. Stick was observed post Covid-19 immunization for 15 minutes without incident. He was provided with Vaccine Information Sheet and instruction to access the V-Safe system.   [name] was instructed to call 911 with any severe reactions post vaccine: Marland Kitchen Difficulty breathing  . Swelling of face and throat  . A fast heartbeat  . A bad rash all over body  . Dizziness and weakness   Immunizations Administered    Name Date Dose VIS Date Route   Pfizer COVID-19 Vaccine 05/11/2019  8:22 AM 0.3 mL 03/02/2018 Intramuscular   Manufacturer: [employer], [employer]   Lot: [id number]   NDC: [address]-0

## 2020-01-14 ENCOUNTER — Ambulatory Visit: Payer: Self-pay

## 2024-02-01 ENCOUNTER — Emergency Department (HOSPITAL_COMMUNITY)
Admission: EM | Admit: 2024-02-01 | Discharge: 2024-02-01 | Disposition: A | Discharge status: Home / Self Care | Payer: Self-pay | Attending: Emergency Medicine | Admitting: Emergency Medicine

## 2024-02-01 ENCOUNTER — Other Ambulatory Visit: Payer: Self-pay

## 2024-02-01 DIAGNOSIS — J039 Acute tonsillitis, unspecified: Secondary | ICD-10-CM | POA: Insufficient documentation

## 2024-02-01 MED ORDER — AMOXICILLIN-POT CLAVULANATE 875-125 MG PO TABS: 1.0000 | ORAL_TABLET | Freq: Two times a day (BID) | ORAL | 0 refills | Status: AC

## 2024-02-01 MED ORDER — AMOXICILLIN-POT CLAVULANATE 875-125 MG PO TABS
1.0000 | ORAL_TABLET | Freq: Once | ORAL | Status: AC
  Administered 2024-02-01: 1 via ORAL
  Filled 2024-02-01: qty 1

## 2024-02-01 NOTE — ED Provider Notes (Signed)
 " [place] [facility] Provider Note   CSN: [id number] Arrival date & time: 02/01/24  1059     Patient presents with: Sore Throat   Vincent Ryan is a 29 y.o. male.   29 year old male with prior medical history as detailed below presents for evaluation.  Patient complains of sore throat x 1 week.  He reports subjective fevers.  He denies cough or congestion.  The history is provided by the patient and medical records.       Prior to Admission medications  Medication Sig Start Date End Date Taking? Authorizing Provider  amoxicillin -clavulanate (AUGMENTIN ) 875-125 MG tablet Take 1 tablet by mouth every 12 (twelve) hours. 02/01/24  Yes Laurice Maude BROCKS, MD  loratadine (CLARITIN) 10 MG tablet Take 10 mg by mouth daily.    [provider]  meloxicam  (MOBIC ) 15 MG tablet Take 1 tablet (15 mg total) by mouth daily. Patient not taking: Reported on 12/17/2015 02/16/15   Claudene Rayfield HERO, MD  methocarbamol  (ROBAXIN ) 500 MG tablet Take 1-2 tablets (500-1,000 mg total) by mouth at bedtime as needed for muscle spasms. Patient not taking: Reported on 12/17/2015 02/16/15   Claudene Rayfield HERO, MD    Allergies: Patient has no known allergies.    Review of Systems  All other systems reviewed and are negative.   Updated Vital Signs BP 132/80   Pulse 100   Temp 99.4 F (37.4 C)   Resp 14   SpO2 99%   Physical Exam Vitals and nursing note reviewed.  Constitutional:      General: He is not in acute distress.    Appearance: He is well-developed.  HENT:     Head: Normocephalic and atraumatic.     Mouth/Throat:     Pharynx: Uvula midline.     Comments: Bilaterally enlarged tonsils with exudates and erythema.  Uvula is midline.  Normal voice.  No difficulty swallowing or hoarseness noted. Eyes:     Conjunctiva/sclera: Conjunctivae normal.  Cardiovascular:     Rate and Rhythm: Normal rate and regular rhythm.     Heart sounds: No murmur  heard. Pulmonary:     Effort: Pulmonary effort is normal. No respiratory distress.     Breath sounds: Normal breath sounds.  Abdominal:     Palpations: Abdomen is soft.     Tenderness: There is no abdominal tenderness.  Musculoskeletal:        General: No swelling.     Cervical back: Neck supple.  Skin:    General: Skin is warm and dry.     Capillary Refill: Capillary refill takes less than 2 seconds.  Neurological:     Mental Status: He is alert.  Psychiatric:        Mood and Affect: Mood normal.     (all labs ordered are listed, but only abnormal results are displayed) Labs Reviewed - No data to display  EKG: None  Radiology: No results found.   Procedures   Medications Ordered in the ED  amoxicillin -clavulanate (AUGMENTIN ) 875-125 MG per tablet 1 tablet (has no administration in time range)                                    Medical Decision Making Patient is presenting with 1 week of symptoms.  Exam suggest bilateral tonsillitis.  Patient would benefit from course of antibiotics to treat suspected tonsillitis.  Patient offered strep  testing, Monospot testing.  He declines.  He would prefer to have his antibiotic and then be discharged.  Presentation is entirely consistent with bacterial tonsillitis.  Strep infection is on the differential -  however, will treat with a course of Augmentin .  Importance of close follow-up was stressed.  Strict return precautions given understood.  Risk Prescription drug management.        Final diagnoses:  Tonsillitis    ED Discharge Orders          Ordered    amoxicillin -clavulanate (AUGMENTIN ) 875-125 MG tablet  Every 12 hours        02/01/24 1113               Laurice Maude BROCKS, MD 02/01/24 1118  "

## 2024-02-01 NOTE — ED Triage Notes (Signed)
 Patient with sore throat x 1 week. No cough, nasal congestion, or other URI symptoms.

## 2024-02-01 NOTE — Discharge Instructions (Addendum)
 Return for any problem.  ?
